# Patient Record
Sex: Female | Born: 1935 | Race: White | Hispanic: No | Marital: Married
Health system: Southern US, Community
[De-identification: ages and names within clinical notes are randomized; demographics above are authoritative.]

---

## 2019-09-09 ENCOUNTER — Encounter (HOSPITAL_COMMUNITY): Payer: Self-pay | Admitting: Emergency Medicine

## 2019-09-09 ENCOUNTER — Emergency Department (HOSPITAL_COMMUNITY): Payer: Medicare Other

## 2019-09-09 ENCOUNTER — Emergency Department (HOSPITAL_COMMUNITY)
Admission: EM | Admit: 2019-09-09 | Discharge: 2019-09-14 | Disposition: E | Payer: Medicare Other | Attending: Emergency Medicine | Admitting: Emergency Medicine

## 2019-09-09 DIAGNOSIS — I619 Nontraumatic intracerebral hemorrhage, unspecified: Secondary | ICD-10-CM | POA: Diagnosis not present

## 2019-09-09 DIAGNOSIS — Z20828 Contact with and (suspected) exposure to other viral communicable diseases: Secondary | ICD-10-CM | POA: Diagnosis not present

## 2019-09-09 DIAGNOSIS — R4020 Unspecified coma: Secondary | ICD-10-CM | POA: Diagnosis present

## 2019-09-09 LAB — POCT I-STAT 7, (LYTES, BLD GAS, ICA,H+H)
Acid-Base Excess: 2 mmol/L (ref 0.0–2.0)
Bicarbonate: 26.9 mmol/L (ref 20.0–28.0)
Calcium, Ion: 1.22 mmol/L (ref 1.15–1.40)
HCT: 38 % (ref 36.0–46.0)
Hemoglobin: 12.9 g/dL (ref 12.0–15.0)
O2 Saturation: 100 %
Patient temperature: 98.6
Potassium: 3.7 mmol/L (ref 3.5–5.1)
Sodium: 136 mmol/L (ref 135–145)
TCO2: 28 mmol/L (ref 22–32)
pCO2 arterial: 40.1 mmHg (ref 32.0–48.0)
pH, Arterial: 7.434 (ref 7.350–7.450)
pO2, Arterial: 447 mmHg — ABNORMAL HIGH (ref 83.0–108.0)

## 2019-09-09 LAB — I-STAT CHEM 8, ED
BUN: 17 mg/dL (ref 8–23)
Calcium, Ion: 1.15 mmol/L (ref 1.15–1.40)
Chloride: 103 mmol/L (ref 98–111)
Creatinine, Ser: 0.8 mg/dL (ref 0.44–1.00)
Glucose, Bld: 151 mg/dL — ABNORMAL HIGH (ref 70–99)
HCT: 41 % (ref 36.0–46.0)
Hemoglobin: 13.9 g/dL (ref 12.0–15.0)
Potassium: 3.8 mmol/L (ref 3.5–5.1)
Sodium: 136 mmol/L (ref 135–145)
TCO2: 27 mmol/L (ref 22–32)

## 2019-09-09 LAB — CBC
HCT: 41.4 % (ref 36.0–46.0)
Hemoglobin: 13.8 g/dL (ref 12.0–15.0)
MCH: 30.7 pg (ref 26.0–34.0)
MCHC: 33.3 g/dL (ref 30.0–36.0)
MCV: 92.2 fL (ref 80.0–100.0)
Platelets: 219 10*3/uL (ref 150–400)
RBC: 4.49 MIL/uL (ref 3.87–5.11)
RDW: 12.6 % (ref 11.5–15.5)
WBC: 9.7 10*3/uL (ref 4.0–10.5)
nRBC: 0 % (ref 0.0–0.2)

## 2019-09-09 LAB — COMPREHENSIVE METABOLIC PANEL
ALT: 20 U/L (ref 0–44)
AST: 31 U/L (ref 15–41)
Albumin: 3.8 g/dL (ref 3.5–5.0)
Alkaline Phosphatase: 61 U/L (ref 38–126)
Anion gap: 11 (ref 5–15)
BUN: 15 mg/dL (ref 8–23)
CO2: 23 mmol/L (ref 22–32)
Calcium: 9.5 mg/dL (ref 8.9–10.3)
Chloride: 103 mmol/L (ref 98–111)
Creatinine, Ser: 0.87 mg/dL (ref 0.44–1.00)
GFR calc Af Amer: >60 mL/min (ref >60)
GFR calc non Af Amer: >60 mL/min (ref >60)
Glucose, Bld: 155 mg/dL — ABNORMAL HIGH (ref 70–99)
Potassium: 3.8 mmol/L (ref 3.5–5.1)
Sodium: 137 mmol/L (ref 135–145)
Total Bilirubin: 0.6 mg/dL (ref 0.3–1.2)
Total Protein: 6.9 g/dL (ref 6.5–8.1)

## 2019-09-09 LAB — DIFFERENTIAL
Abs Immature Granulocytes: 0.05 10*3/uL (ref 0.00–0.07)
Basophils Absolute: 0.1 10*3/uL (ref 0.0–0.1)
Basophils Relative: 1 %
Eosinophils Absolute: 0.2 10*3/uL (ref 0.0–0.5)
Eosinophils Relative: 2 %
Immature Granulocytes: 1 %
Lymphocytes Relative: 45 %
Lymphs Abs: 4.5 10*3/uL — ABNORMAL HIGH (ref 0.7–4.0)
Monocytes Absolute: 0.9 10*3/uL (ref 0.1–1.0)
Monocytes Relative: 9 %
Neutro Abs: 4.1 10*3/uL (ref 1.7–7.7)
Neutrophils Relative %: 42 %

## 2019-09-09 LAB — CBG MONITORING, ED: Glucose-Capillary: 146 mg/dL — ABNORMAL HIGH (ref 70–99)

## 2019-09-09 LAB — RESPIRATORY PANEL BY RT PCR (FLU A&B, COVID)
Influenza A by PCR: NEGATIVE
Influenza B by PCR: NEGATIVE
SARS Coronavirus 2 by RT PCR: NEGATIVE

## 2019-09-09 LAB — APTT: aPTT: 26 seconds (ref 24–36)

## 2019-09-09 LAB — POC SARS CORONAVIRUS 2 AG -  ED: SARS Coronavirus 2 Ag: NEGATIVE

## 2019-09-09 LAB — ETHANOL: Alcohol, Ethyl (B): <10 mg/dL (ref <10)

## 2019-09-09 LAB — PROTIME-INR
INR: 0.9 (ref 0.8–1.2)
Prothrombin Time: 12 seconds (ref 11.4–15.2)

## 2019-09-09 MED ORDER — PROPOFOL 1000 MG/100ML IV EMUL
INTRAVENOUS | Status: AC
  Filled 2019-09-09: qty 100

## 2019-09-09 MED ORDER — ETOMIDATE 2 MG/ML IV SOLN
INTRAVENOUS | Status: AC | PRN
  Administered 2019-09-09: 20 mg via INTRAVENOUS

## 2019-09-09 MED ORDER — SUCCINYLCHOLINE CHLORIDE 20 MG/ML IJ SOLN
INTRAMUSCULAR | Status: AC | PRN
  Administered 2019-09-09: 150 mg via INTRAVENOUS

## 2019-09-09 MED ORDER — PROPOFOL 1000 MG/100ML IV EMUL
5.0000 ug/kg/min | INTRAVENOUS | Status: DC
  Administered 2019-09-09: 10 ug/kg/min via INTRAVENOUS

## 2019-09-09 MED ORDER — LABETALOL HCL 5 MG/ML IV SOLN
10.0000 mg | Freq: Once | INTRAVENOUS | Status: AC
  Administered 2019-09-09: 10 mg via INTRAVENOUS

## 2019-09-14 NOTE — ED Provider Notes (Signed)
[facility] EMERGENCY DEPARTMENT Provider Note   CSN: [id number] Arrival date & time: 2019-10-08  0207     History Chief Complaint  Patient presents with  . Code Stroke    Tina Vasquez is a 86 y.o. female.  Patient is an 86 year old female with unknown past medical history.  She is brought by EMS after being found unresponsive by her husband.  From what EMS has told me, she woke in the night complaining of headache and congestion.  Later on, her husband found her with irregular respirations and not responding.  Patient was transported here by EMS with ventilations by bag valve mask and nasal trumpets in place.  Patient adds no additional history secondary to severity of illness.  The history is provided by the patient.       No past medical history on file.  There are no problems to display for this patient.      OB History   No obstetric history on file.     No family history on file.  Social History   Tobacco Use  . Smoking status: Not on file  Substance Use Topics  . Alcohol use: Not on file  . Drug use: Not on file    Home Medications Prior to Admission medications   Not on File    Allergies    Patient has no allergy information on record.  Review of Systems   Review of Systems  Unable to perform ROS: Acuity of condition    Physical Exam Updated Vital Signs BP (!) 208/121   Resp 17   Ht 5\' 1"  (1.549 m)   Wt 79.4 kg Comment: ED estimate  SpO2 100%   BMI 33.07 kg/m   Physical Exam Vitals and nursing note reviewed.  Constitutional:      Appearance: She is well-developed. She is not diaphoretic.     Comments: Patient is an 86 year old, acutely ill female.  Patient unresponsive.  HENT:     Head: Normocephalic and atraumatic.  Eyes:     Comments: Pupils are 4 mm and minimally reactive bilaterally.  Cardiovascular:     Rate and Rhythm: Tachycardia present. Rhythm irregular.     Heart sounds: No murmur. No friction rub.  No gallop.   Pulmonary:     Effort: Pulmonary effort is normal. No respiratory distress.     Breath sounds: Normal breath sounds. No wheezing.  Abdominal:     General: Bowel sounds are normal. There is no distension.     Palpations: Abdomen is soft.     Tenderness: There is no abdominal tenderness.  Musculoskeletal:        General: Normal range of motion.     Cervical back: Normal range of motion and neck supple.  Skin:    General: Skin is warm and dry.  Neurological:     Comments: She is unresponsive with occasional posturing.  She does have a gag reflex, but no response to noxious stimuli.     ED Results / Procedures / Treatments   Labs (all labs ordered are listed, but only abnormal results are displayed) Labs Reviewed  CBG MONITORING, ED - Abnormal; Notable for the following components:      Result Value   Glucose-Capillary 146 (*)    All other components within normal limits  ETHANOL  PROTIME-INR  APTT  CBC  DIFFERENTIAL  COMPREHENSIVE METABOLIC PANEL  RAPID URINE DRUG SCREEN, HOSP PERFORMED  URINALYSIS, ROUTINE W REFLEX MICROSCOPIC  I-STAT CHEM 8, ED  EKG EKG Interpretation  Date/Time:  09/12/19 02:16:39 EST Ventricular Rate:  165 PR Interval:    QRS Duration: 81 QT Interval:  272 QTC Calculation: 451 R Axis:   50 Text Interpretation: Atrial fibrillation with rapid V-rate Repolarization abnormality, prob rate related Confirmed by Veryl Speak [phone]) on 12-Sep-2019 2:34:38 AM   Radiology No results found.  Procedures Procedures (including critical care time)  Medications Ordered in ED Medications  etomidate (AMIDATE) injection (20 mg Intravenous Given September 12, 2019 0214)  succinylcholine (ANECTINE) injection (150 mg Intravenous Given September 12, 2019 0215)  propofol (DIPRIVAN) 1000 MG/100ML infusion (has no administration in time range)  labetalol (NORMODYNE) injection 10 mg (10 mg Intravenous Given 2019/09/12 0221)    ED Course  I have  reviewed the triage vital signs and the nursing notes.  Pertinent labs & imaging results that were available during my care of the patient were reviewed by me and considered in my medical decision making (see chart for details).    MDM Rules/Calculators/A&P  Patient arrived here as a code stroke after being found unresponsive in her bed by her husband.  She arrived with nasal trumpets in place and respiration by bag-valve-mask.  She arrived here with altered GCS.  She was posturing, but making no purposeful movement.  Initial vital signs showed an irregular tachycardia consistent with A. fib and blood pressure which was markedly elevated.  Her pupils were minimally, if reactive at all.    RSI was then performed using 20 mg of etomidate and 150 mg of succinylcholine.  The cords were easily visualized using the #3 glide scope blade.  A 7.5 endotracheal tube was easily placed.  Placement confirmed with direct visualization, end-tidal CO2, and auscultation over the heart and lungs.  Patient then went to CT scan for emergent imaging.  Unfortunately this revealed a massive right hemispheric extra-axial hemorrhage with leftward shift and evidence for subfalcine and uncal herniation.  Patient was seen in conjunction with Dr. Cheral Marker from neurology.  He has reviewed the CT scan and does not feel as though this is compatible with life.  I have also discussed these findings with Dr. Arnoldo Morale from neurosurgery.  He has reviewed the images and also feels as though this is not survivable.  Myself and Dr. Cheral Marker spent significant time with the family at bedside.  They have decided to make the patient comfort measures.  She remained on the ventilator until the daughter arrived from the [facility].  The patient was then extubated and passed away shortly thereafter.  I notified the family of her passing.    Patient is here from out of town and has no local primary doctor.  Death certificate signed by  myself.  CRITICAL CARE Performed by: Veryl Speak Total critical care time: 70 minutes Critical care time was exclusive of separately billable procedures and treating other patients. Critical care was necessary to treat or prevent imminent or life-threatening deterioration. Critical care was time spent personally by me on the following activities: development of treatment plan with patient and/or surrogate as well as nursing, discussions with consultants, evaluation of patient's response to treatment, examination of patient, obtaining history from patient or surrogate, ordering and performing treatments and interventions, ordering and review of laboratory studies, ordering and review of radiographic studies, pulse oximetry and re-evaluation of patient's condition.   Final Clinical Impression(s) / ED Diagnoses Final diagnoses:  None    Rx / DC Orders ED Discharge Orders    None  Geoffery Lyonselo, Murielle Stang, MD Jan 14, [phone]

## 2019-09-14 NOTE — ED Notes (Signed)
Called portable, inroute

## 2019-09-14 NOTE — ED Notes (Signed)
Family at bedside.  Provider updated them as to the situation

## 2019-09-14 NOTE — ED Notes (Signed)
L pupil now blown. 5 non reactive.

## 2019-09-14 NOTE — Consult Note (Signed)
I was contacted by Dr. Stark Jock regarding this patient.  I have reviewed the patient's CT scan which demonstrates a huge right subdural hematoma with severe midline shift.  She also has a middle fossa lesion consistent with a giant aneurysm versus meningioma.  I do not think this is  a survivalable injury and I would recommend comfort care measures only.

## 2019-09-14 NOTE — ED Triage Notes (Addendum)
Per EMS, pt from home.  Husband stated that at 11:30 last night she woke up and stated she was congested and had a headache.  At 1:30am, spouse called EMS b/c pt was unresponsive.  EMS reports a L sided facial droop, left pupil was blown and unreactive, right pupil was a 2 and un reactive.  Only response was too pain of the IO being inserted.  Unaware if pt is on blood thinners but she did have a fall last week, no medical treatment at that time.  210/110 HR 120 135 CBG 100% on RA

## 2019-09-14 NOTE — ED Notes (Signed)
Pt noted to be bradycardic at rate of 40; Dr.Delo to bedside, speaking with pt's daughter and husband.

## 2019-09-14 NOTE — ED Notes (Addendum)
MD Lindzen aware of blt blown pupils

## 2019-09-14 NOTE — Procedures (Signed)
Extubation Procedure Note  Patient Details:   Name: Tina Vasquez DOB: 1934-07-05 MRN: [id number]   Airway Documentation:  Airway 7.5 mm (Active)  Secured at (cm) 23 cm 19-Sep-2019 0413  Measured From Lips September 19, 2019 0413  Secured Location Right 09-19-2019 0413  Secured By [employer] Sep 19, 2019 0413  Tube Holder Repositioned Yes 2019/09/19 0413  Cuff Pressure (cm H2O) 30 cm H2O 09/19/2019 0221  Site Condition Dry 09-19-19 0413   Vent end date: (not recorded) Vent end time: (not recorded)   Evaluation  O2 sats: transiently fell during during procedure Complications: No apparent complications Patient did not tolerate procedure well. Bilateral Breath Sounds: Diminished   No   Performed a terminal extubation on patient.   Keyanah Kozicki R Ajooni Karam September 19, 2019, 6:22 AM

## 2019-09-14 NOTE — ED Notes (Signed)
Family at bedside ready for extubation

## 2019-09-14 NOTE — Consult Note (Signed)
NEURO HOSPITALIST CONSULT NOTE   Requesting physician: Dr. Judd Lien  Reason for Consult: Acute onset of unresponsiveness  History obtained from:  EMS  HPI:                                                                                                                                          Tina Vasquez is an 86 y.o. female presenting acutely from home after husband noted her to be unresponsive at 1:30 AM. LKN was 11:30 PM when she went to sleep. She had been complaining of a headache. She had fallen about one week ago and had struck her head at that time. When her husband woke up, it was to the sound of agonal breathing coming from the patient. GCS was 3 on EMS arrival. BP was 200/82. On arrival the patient required emergent intubation due to inability to protect airway.   Per family, she has no prior history of stroke or MI.    PMHx HTN High cholesterol  PSHx Bilateral knee surgery Hysterectomy Cutaneous melanoma removal  SocHx Married  All Morphine Penicillins Latex  Meds MVI Norvasc Omeprazole Zocor Benicar Glucosamine       ROS:                                                                                                                                       Unable to assess due to GCS 3.   Resp. rate 20. BP (!) 208/121   Resp 17   Ht 5\' 1"  (1.549 m)   Wt 79.4 kg Comment: ED estimate  SpO2 100%   BMI 33.07 kg/m    General Examination:  Physical Exam  HEENT-  /AT  Lungs- Currently being bagged prior to intubation Extremities- Warm and well perfused  Neurological Examination Mental Status: GCS 5. No eye opening to any stimuli (1); no verbal response to any stimuli (1); flexion to pain (3).   Cranial Nerves: II: No blink to threat. Pupils are 6 mm and unreactive bilaterally. Does not gaze towards or away from any visual  stimuli. III,IV, VI: No doll's eye reflex. Eyes conjugately at the midline. No nystagmus.  V,VII: Left facial droop. No response to brow ridge pressure bilaterally VIII: No response to voice IX,X: Unable to visualize palate XI: Unable to assess XII: Unable to assess Motor/Sensory: BUE flaccid with no movement spontaneously or to any stimulus BLE flaccid with weak 2/5 withdrawal to plantar stimulation Deep Tendon Reflexes:  2+ bilateral brachioradialis.  Absent patellars (prior knee surgeries) Plantars: Mute bilaterally  Cerebellar/Gait: Unable to assess   Lab Results: Basic Metabolic Panel: No results for input(s): NA, K, CL, CO2, GLUCOSE, BUN, CREATININE, CALCIUM, MG, PHOS in the last 168 hours.  CBC: No results for input(s): WBC, NEUTROABS, HGB, HCT, MCV, PLT in the last 168 hours.  Cardiac Enzymes: No results for input(s): CKTOTAL, CKMB, CKMBINDEX, TROPONINI in the last 168 hours.  Lipid Panel: No results for input(s): CHOL, TRIG, HDL, CHOLHDL, VLDL, LDLCALC in the last 168 hours.  Imaging:  CT head: 1. Massive right hemispheric extra-axial hemorrhage with leftward midline shift of 18 mm. 2. Subfalcine and uncal herniation. 3. Hyperdense mass in the right middle cranial fossa with suspected intra-axial hemorrhage in the anterior right temporal lobe. Fall the mass appears most like a meningioma, a hemorrhagic intraparenchymal mass would be difficult to exclude. MRI with and without contrast might be helpful for further characterization. 4. Thin left convexity subdural hematoma.   Assessment: 86 year old female presenting with acute onset of unresponsiveness and left blown pupil progressing to bilateral blown pupils.  1. CT shows massive right hemisphere subdural hematoma with 1.8 cm right to left midline shift, subfalcine and uncal herniation, and compression of the midbrain.  2. Discussed the patient's condition with family. Images from CT reviewed with family as  well. The hemorrhage is highly unlikely to be survivable.   Recommendations: 1. Neurosurgery consult for second opinion on prognosis.  2. Discussed with ED Physician, Dr. Stark Jock.   65 minutes of time spent in the emergent neurological evaluation and management of this critically ill patient.    Electronically signed: Dr. Kerney Elbe 09-11-19, 2:20 AM

## 2019-09-14 NOTE — Progress Notes (Signed)
Assisted family members as they were present at end of life.  Rev. [place].

## 2019-09-14 DEATH — deceased

## 2021-01-24 IMAGING — CT CT HEAD CODE STROKE
4 series · 14 of 47 positions shown, 16 images · non-contrast
Comparison: None.
COMPARISON: None.

Addendum:
CLINICAL DATA: Code stroke.  Unresponsive

EXAM:
CT HEAD WITHOUT CONTRAST
TECHNIQUE: Contiguous axial images were obtained from the base of the skull
through the vertex without intravenous contrast.

[Series 3: head wo · axial · 0.44mm/px · z∈[-156,-30]mm · 7 of 35 slices shown, 9 images]
[im 5/35  brain]
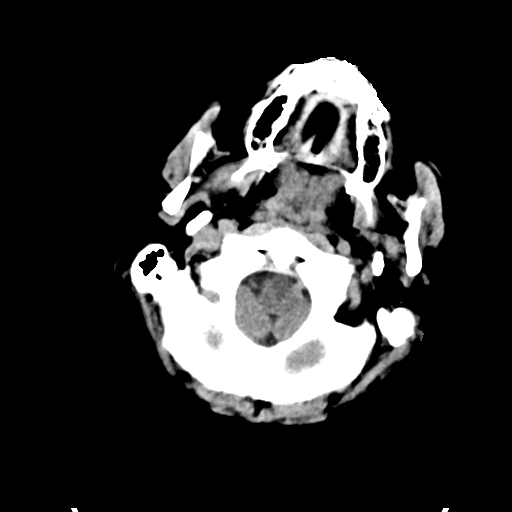
[im 5/35  bone]
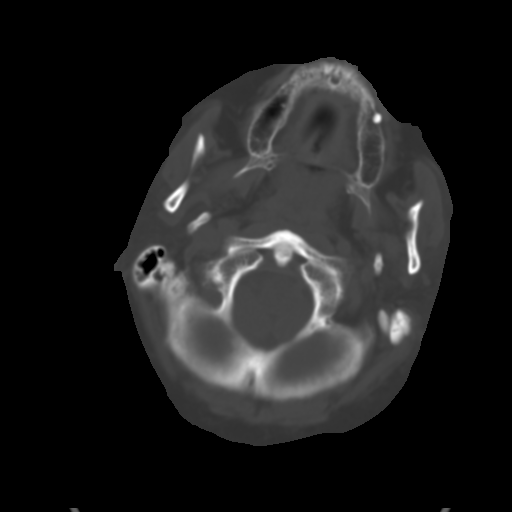
[im 9/35  brain]
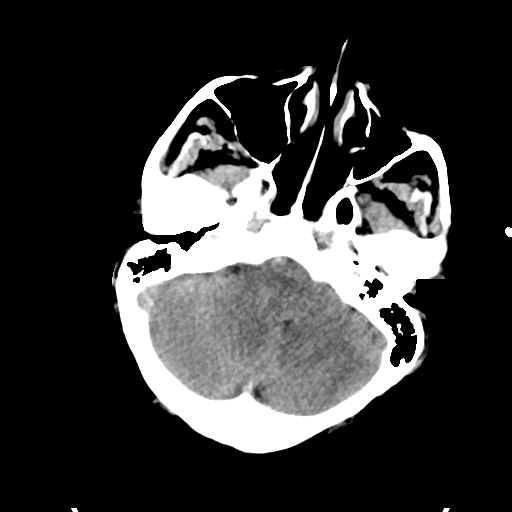
[im 13/35  brain]
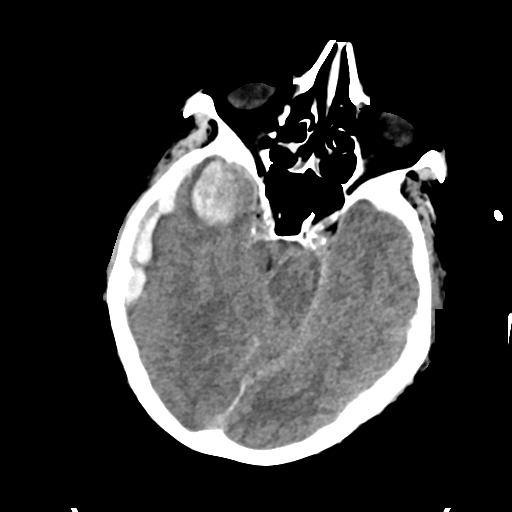
[im 18/35  brain]
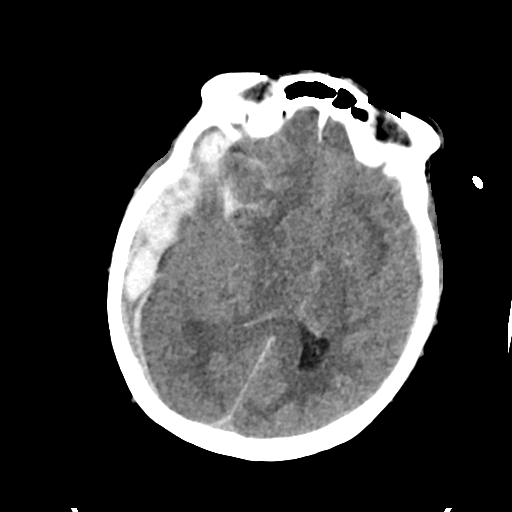
[im 22/35  brain]
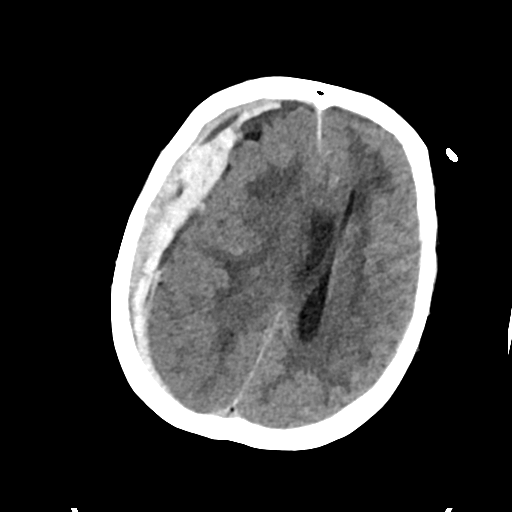
[im 22/35  bone]
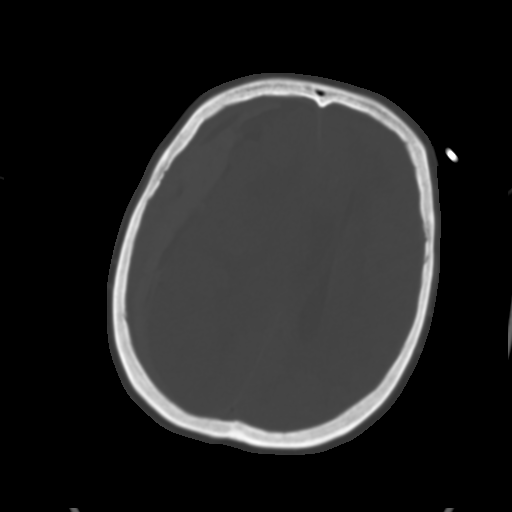
[im 26/35  brain]
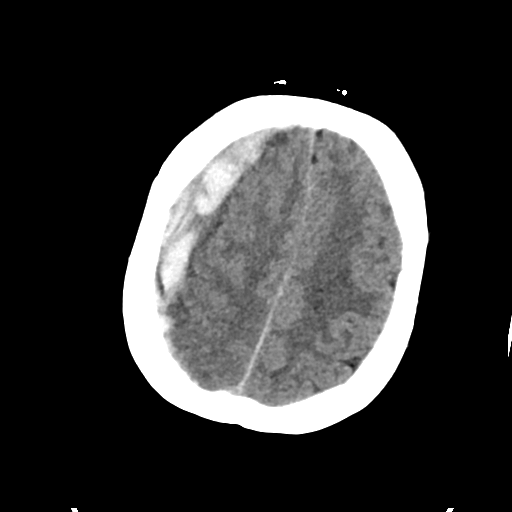
[im 30/35  brain]
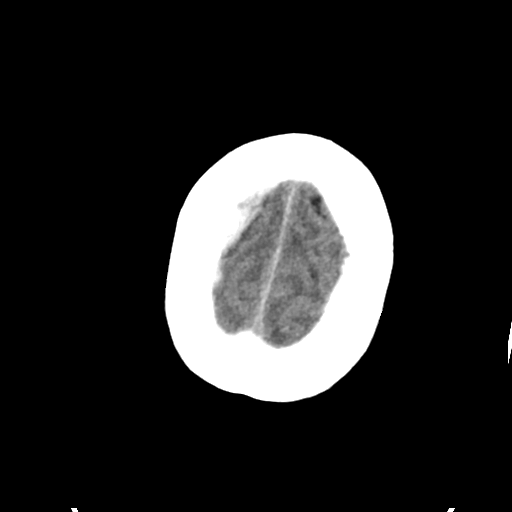

[Series 4: head bone · axial · 0.44mm/px · 1 of 87 slices shown]
[im 9/87  bone]
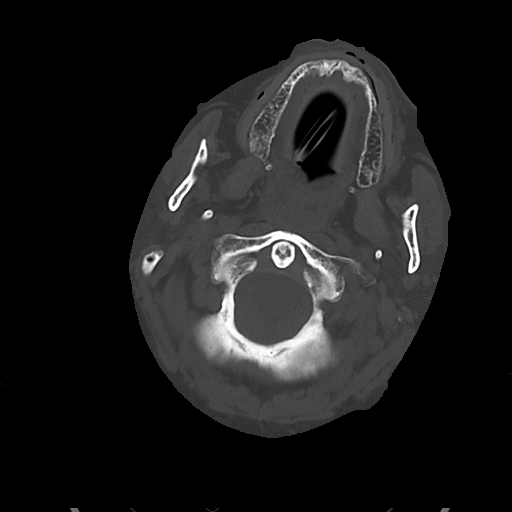

[Series 5: cor soft · coronal · 0.39mm/px · 3 of 71 slices shown]
[im 24/71  brain]
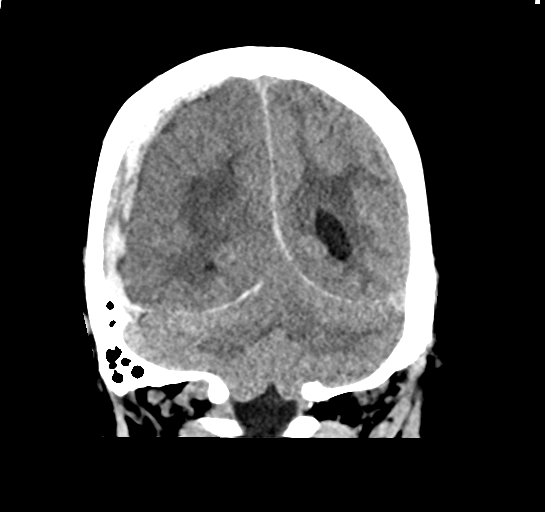
[im 32/71  brain]
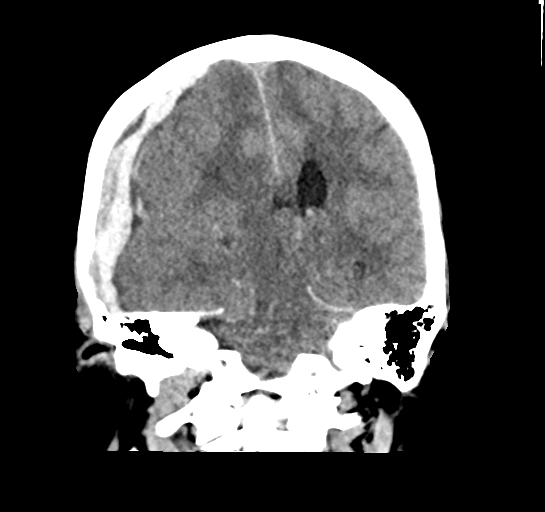
[im 39/71  brain]
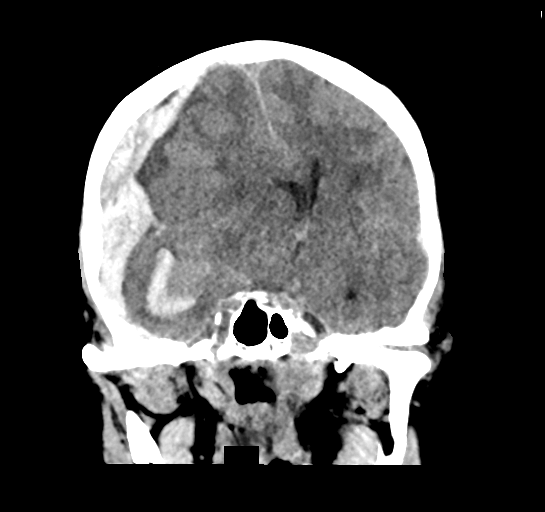

[Series 6: sag soft · sagittal · 0.39mm/px · 3 of 62 slices shown]
[im 21/62  brain]
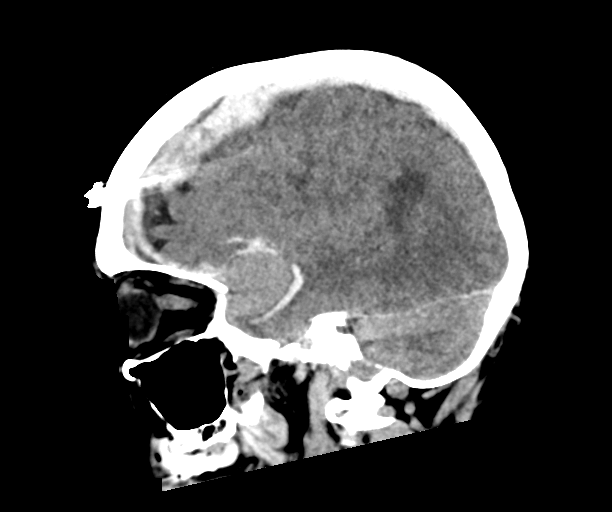
[im 31/62  brain]
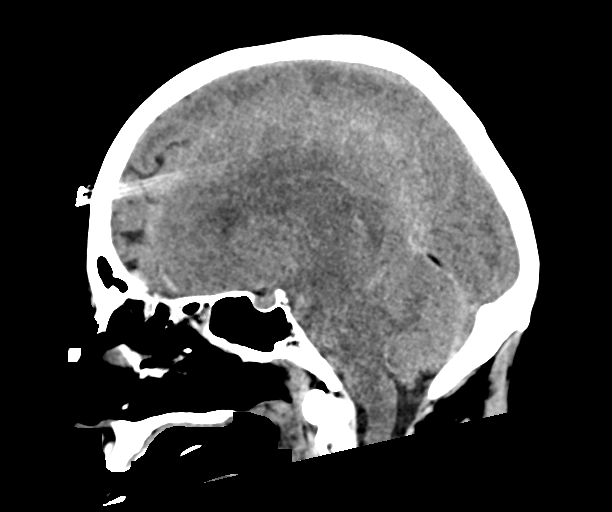
[im 41/62  brain]
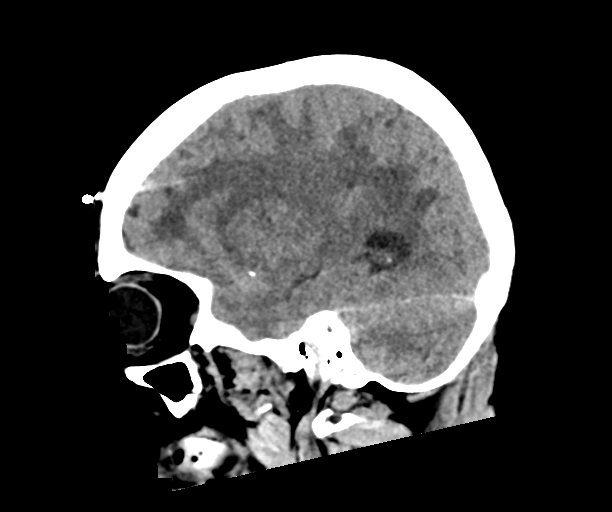

[14 of 47 positions shown; findings below may reference images not displayed]

FINDINGS: Brain: There is a large right hemispheric extra-axial hemorrhage,
predominantly subdural. There is mixed density with thickness
measuring up to 22 mm. There is leftward midline shift measuring
cm with subfalcine herniation. There is crowding of the basal
cisterns downward transtentorial herniation of the right uncus.
Hyperdense mass in the right middle cranial fossa measures 2.6 x
cm. There is a small amount of subarachnoid blood in the right
sylvian fissure. There is edema and suspected intraparenchymal
hemorrhage in the anterior right temporal lobe. There is also a thin
left convexity subdural hematoma that measures 4 mm.

Vascular: Atherosclerotic calcification of the internal carotid
arteries.

Skull: Normal. Negative for fracture or focal lesion.

Sinuses/Orbits: No acute finding.

Other: None.

ASPECTS (Alberta Stroke Program Early CT Score)

Not applicable in the setting of acute hemorrhage.
IMPRESSION: 1. Massive right hemispheric extra-axial hemorrhage with leftward
midline shift of 18 mm.
2. Subfalcine and uncal herniation.
3. Hyperdense mass in the right middle cranial fossa with suspected
intra-axial hemorrhage in the anterior right temporal lobe. Fall the
mass appears most like a meningioma, a hemorrhagic intraparenchymal
mass would be difficult to exclude. MRI with and without contrast
might be helpful for further characterization.
4. Thin left convexity subdural hematoma.

These results were communicated to Dr. Dleke Tiger at [DATE] on
09/09/2019 by text page via the [employer] messaging system. Awaiting
confirmatory return call.

ADDENDUM:
Critical Value/emergent results were called by telephone at the time
of interpretation on 09/09/2019 at [DATE] to provider Dleke Tiger
, who verbally acknowledged these results.

*** End of Addendum ***
FINDINGS: Brain: There is a large right hemispheric extra-axial hemorrhage,
predominantly subdural. There is mixed density with thickness
measuring up to 22 mm. There is leftward midline shift measuring
cm with subfalcine herniation. There is crowding of the basal
cisterns downward transtentorial herniation of the right uncus.
Hyperdense mass in the right middle cranial fossa measures 2.6 x
cm. There is a small amount of subarachnoid blood in the right
sylvian fissure. There is edema and suspected intraparenchymal
hemorrhage in the anterior right temporal lobe. There is also a thin
left convexity subdural hematoma that measures 4 mm.

Vascular: Atherosclerotic calcification of the internal carotid
arteries.

Skull: Normal. Negative for fracture or focal lesion.

Sinuses/Orbits: No acute finding.

Other: None.

ASPECTS (Alberta Stroke Program Early CT Score)

Not applicable in the setting of acute hemorrhage.
IMPRESSION: 1. Massive right hemispheric extra-axial hemorrhage with leftward
midline shift of 18 mm.
2. Subfalcine and uncal herniation.
3. Hyperdense mass in the right middle cranial fossa with suspected
intra-axial hemorrhage in the anterior right temporal lobe. Fall the
mass appears most like a meningioma, a hemorrhagic intraparenchymal
mass would be difficult to exclude. MRI with and without contrast
might be helpful for further characterization.
4. Thin left convexity subdural hematoma.

These results were communicated to Dr. Dleke Tiger at [DATE] on
09/09/2019 by text page via the [employer] messaging system. Awaiting
confirmatory return call.

## 2021-01-24 IMAGING — DX DG CHEST 1V PORT
1 series · 1 of 1 positions shown · non-contrast
Comparison: None.

CLINICAL DATA: Code stroke, intubation

EXAM:
PORTABLE CHEST 1 VIEW

[chest ap]
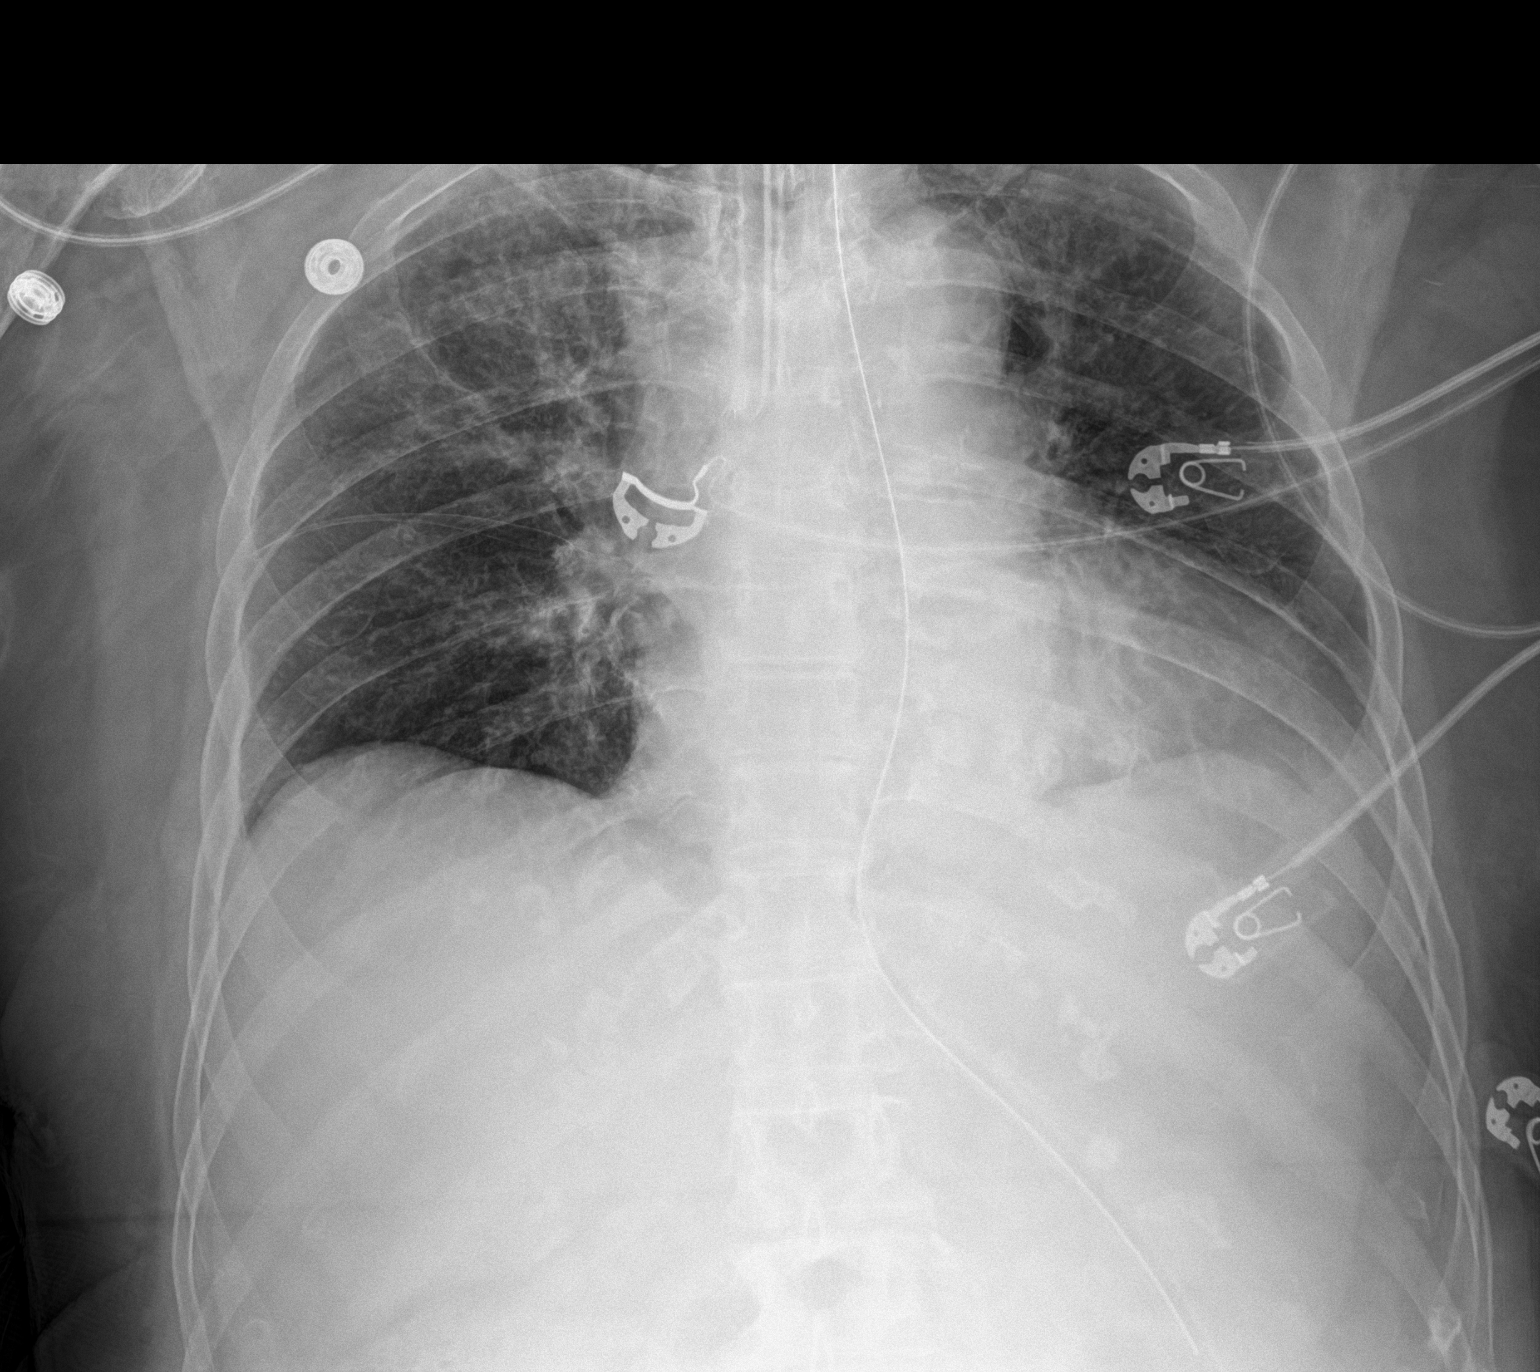

[1 of 1 positions shown; findings below may reference images not displayed]

FINDINGS: Low positioning of the endotracheal tube approximating the carina. A
transesophageal tube tip and side port distal to the GE junction,
terminating below the level of imaging. Mixed patchy and
interstitial opacities are present in both upper lobes and in the
left infrahilar lung. Minimal opacity in the right infrahilar lung
as well. No pneumothorax. Suspect trace left pleural fluid. Age
indeterminate though likely remote deformity of the right humeral
head. Severe degenerative changes noted in both shoulders.
Additional mild degenerative changes in the spine. No other acute
osseous or soft tissue abnormality.
IMPRESSION: 1. Low positioning of the endotracheal tube approximating the
carina. Recommend retraction 3-5 cm to position in the mid trachea.
2. Mixed patchy and interstitial opacities in both upper lobes and
left infrahilar lung.
3. Suspect trace left pleural fluid.
4. Age indeterminate though likely remote deformity of the right
humeral head. Correlate for point tenderness.

These results were called by telephone at the time of interpretation
on 09/09/2019 at [DATE] to provider KLAUS-UWE POHLERS , who verbally
acknowledged these results.
